# Patient Record
Sex: Female | Born: 1990 | Hispanic: No | Marital: Single | Smoking: Never smoker
Health system: Southern US, Community
[De-identification: ages and names within clinical notes are randomized; demographics above are authoritative.]

---

## 2015-06-14 ENCOUNTER — Emergency Department (HOSPITAL_COMMUNITY): Payer: BLUE CROSS/BLUE SHIELD

## 2015-06-14 ENCOUNTER — Encounter (HOSPITAL_COMMUNITY): Payer: Self-pay | Admitting: Emergency Medicine

## 2015-06-14 ENCOUNTER — Emergency Department (HOSPITAL_COMMUNITY)
Admission: EM | Admit: 2015-06-14 | Discharge: 2015-06-14 | Disposition: A | Discharge status: Home / Self Care | Payer: BLUE CROSS/BLUE SHIELD | Attending: Emergency Medicine | Admitting: Emergency Medicine

## 2015-06-14 DIAGNOSIS — R1013 Epigastric pain: Secondary | ICD-10-CM | POA: Diagnosis not present

## 2015-06-14 DIAGNOSIS — Z3202 Encounter for pregnancy test, result negative: Secondary | ICD-10-CM | POA: Insufficient documentation

## 2015-06-14 DIAGNOSIS — R109 Unspecified abdominal pain: Secondary | ICD-10-CM | POA: Diagnosis present

## 2015-06-14 LAB — COMPREHENSIVE METABOLIC PANEL
ALBUMIN: 4 g/dL (ref 3.5–5.0)
ALK PHOS: 35 U/L — AB (ref 38–126)
ALT: 11 U/L — ABNORMAL LOW (ref 14–54)
ANION GAP: 9 (ref 5–15)
AST: 21 U/L (ref 15–41)
BUN: <5 mg/dL — ABNORMAL LOW (ref 6–20)
CHLORIDE: 104 mmol/L (ref 101–111)
CO2: 24 mmol/L (ref 22–32)
Calcium: 9.3 mg/dL (ref 8.9–10.3)
Creatinine, Ser: 0.67 mg/dL (ref 0.44–1.00)
GFR calc non Af Amer: >60 mL/min (ref >60)
GLUCOSE: 106 mg/dL — AB (ref 65–99)
POTASSIUM: 4.1 mmol/L (ref 3.5–5.1)
SODIUM: 137 mmol/L (ref 135–145)
Total Bilirubin: 0.9 mg/dL (ref 0.3–1.2)
Total Protein: 7.2 g/dL (ref 6.5–8.1)

## 2015-06-14 LAB — URINALYSIS, ROUTINE W REFLEX MICROSCOPIC
BILIRUBIN URINE: NEGATIVE
Glucose, UA: NEGATIVE mg/dL
Ketones, ur: NEGATIVE mg/dL
Leukocytes, UA: NEGATIVE
Nitrite: NEGATIVE
PH: 8 (ref 5.0–8.0)
Protein, ur: NEGATIVE mg/dL
SPECIFIC GRAVITY, URINE: 1.012 (ref 1.005–1.030)

## 2015-06-14 LAB — CBC
HEMATOCRIT: 36.6 % (ref 36.0–46.0)
HEMOGLOBIN: 11.9 g/dL — AB (ref 12.0–15.0)
MCH: 28.3 pg (ref 26.0–34.0)
MCHC: 32.5 g/dL (ref 30.0–36.0)
MCV: 87.1 fL (ref 78.0–100.0)
Platelets: 290 10*3/uL (ref 150–400)
RBC: 4.2 MIL/uL (ref 3.87–5.11)
RDW: 12.4 % (ref 11.5–15.5)
WBC: 8.1 10*3/uL (ref 4.0–10.5)

## 2015-06-14 LAB — LIPASE, BLOOD: LIPASE: 32 U/L (ref 11–51)

## 2015-06-14 LAB — URINE MICROSCOPIC-ADD ON: WBC, UA: NONE SEEN WBC/hpf (ref 0–5)

## 2015-06-14 LAB — POC URINE PREG, ED: Preg Test, Ur: NEGATIVE

## 2015-06-14 MED ORDER — SUCRALFATE 1 G PO TABS: 1.0000 g | ORAL_TABLET | Freq: Three times a day (TID) | ORAL | Status: AC

## 2015-06-14 MED ORDER — HYDROCODONE-ACETAMINOPHEN 5-325 MG PO TABS: 1.0000 | ORAL_TABLET | Freq: Four times a day (QID) | ORAL | Status: AC | PRN

## 2015-06-14 MED ORDER — SODIUM CHLORIDE 0.9 % IV BOLUS (SEPSIS)
500.0000 mL | Freq: Once | INTRAVENOUS | Status: AC
  Administered 2015-06-14: 500 mL via INTRAVENOUS

## 2015-06-14 MED ORDER — ONDANSETRON HCL 4 MG/2ML IJ SOLN
4.0000 mg | Freq: Once | INTRAMUSCULAR | Status: AC
  Administered 2015-06-14: 4 mg via INTRAVENOUS
  Filled 2015-06-14: qty 2

## 2015-06-14 MED ORDER — HYDROMORPHONE HCL 1 MG/ML IJ SOLN
1.0000 mg | Freq: Once | INTRAMUSCULAR | Status: AC
  Administered 2015-06-14: 1 mg via INTRAVENOUS
  Filled 2015-06-14: qty 1

## 2015-06-14 MED ORDER — IOPAMIDOL (ISOVUE-300) INJECTION 61%
INTRAVENOUS | Status: AC
  Administered 2015-06-14: 96 mL
  Filled 2015-06-14: qty 100

## 2015-06-14 MED ORDER — DIATRIZOATE MEGLUMINE & SODIUM 66-10 % PO SOLN
ORAL | Status: DC
Start: 2015-06-14 — End: 2015-06-15
  Filled 2015-06-14: qty 30

## 2015-06-14 MED ORDER — FENTANYL CITRATE (PF) 100 MCG/2ML IJ SOLN
50.0000 ug | Freq: Once | INTRAMUSCULAR | Status: AC
  Administered 2015-06-14: 50 ug via INTRAVENOUS
  Filled 2015-06-14: qty 2

## 2015-06-14 MED ORDER — FAMOTIDINE 20 MG PO TABS: 20.0000 mg | ORAL_TABLET | Freq: Two times a day (BID) | ORAL | Status: AC

## 2015-06-14 MED ORDER — PANTOPRAZOLE SODIUM 40 MG IV SOLR
40.0000 mg | Freq: Once | INTRAVENOUS | Status: AC
  Administered 2015-06-14: 40 mg via INTRAVENOUS
  Filled 2015-06-14: qty 40

## 2015-06-14 MED ORDER — SODIUM CHLORIDE 0.9 % IV SOLN
INTRAVENOUS | Status: DC
Start: 2015-06-14 — End: 2015-06-15
  Administered 2015-06-14: 18:00:00 via INTRAVENOUS

## 2015-06-14 NOTE — ED Notes (Signed)
C/o severe mid upper abd pain since 5am.  Denies nausea, vomiting, and diarrhea.

## 2015-06-14 NOTE — ED Notes (Signed)
Patient transported to CT 

## 2015-06-14 NOTE — ED Notes (Signed)
Patient transported to X-ray 

## 2015-06-14 NOTE — ED Provider Notes (Signed)
CSN: [id number]     Arrival date & time 06/14/15  1555 History   First MD Initiated Contact with Patient 06/14/15 1710     Chief Complaint  Patient presents with  . Abdominal Pain     (Consider location/radiation/quality/duration/timing/severity/associated sxs/prior Treatment) Patient is a 25 y.o. female presenting with abdominal pain. The history is provided by the patient.  Abdominal Pain Associated symptoms: no chest pain, no dysuria, no fever, no nausea, no shortness of breath and no vomiting   Patient with acute onset of epigastric abdominal pain at about 5:30 in the morning. But calm and sharp intermittent waves. 8 out of 10. Nonradiating. Not associated with nausea or vomiting. Would sometimes go away for over an hour. Patient never had any pain like this before.  History reviewed. No pertinent past medical history. History reviewed. No pertinent past surgical history. No family history on file. Social History  Substance Use Topics  . Smoking status: Never Smoker   . Smokeless tobacco: None  . Alcohol Use: Yes     Comment: social   OB History    No data available     Review of Systems  Constitutional: Negative for fever.  HENT: Negative for congestion.   Eyes: Negative for visual disturbance.  Respiratory: Negative for shortness of breath.   Cardiovascular: Negative for chest pain.  Gastrointestinal: Positive for abdominal pain. Negative for nausea and vomiting.  Genitourinary: Negative for dysuria.  Musculoskeletal: Negative for back pain.  Neurological: Negative for headaches.  Hematological: Does not bruise/bleed easily.  Psychiatric/Behavioral: Negative for confusion.      Allergies  Review of patient's allergies indicates no known allergies.  Home Medications   Prior to Admission medications   Medication Sig Start Date End Date Taking? Authorizing Provider  ibuprofen (ADVIL,MOTRIN) 200 MG tablet Take 400 mg by mouth every 6 (six) hours as needed for  headache.   Yes Historical Provider, MD  famotidine (PEPCID) 20 MG tablet Take 1 tablet (20 mg total) by mouth 2 (two) times daily. 06/14/15   Vanetta Mulders, MD  HYDROcodone-acetaminophen (NORCO/VICODIN) 5-325 MG tablet Take 1-2 tablets by mouth every 6 (six) hours as needed for moderate pain. 06/14/15   Vanetta Mulders, MD  sucralfate (CARAFATE) 1 g tablet Take 1 tablet (1 g total) by mouth 4 (four) times daily -  with meals and at bedtime. 06/14/15   Vanetta Mulders, MD   BP 116/76 mmHg  Pulse 68  Temp(Src) 97.9 F (36.6 C) (Oral)  Resp 20  Ht  (1.575 m)  Wt 43.545 kg  BMI 17.55 kg/m2  SpO2 100%  LMP 06/11/2015 Physical Exam  Constitutional: She is oriented to person, place, and time. She appears well-developed and well-nourished.  HENT:  Head: Normocephalic and atraumatic.  Mouth/Throat: Oropharynx is clear and moist.  Eyes: Conjunctivae and EOM are normal. Pupils are equal, round, and reactive to light.  Neck: Normal range of motion. Neck supple.  Cardiovascular: Normal rate, regular rhythm and normal heart sounds.   No murmur heard. Pulmonary/Chest: Effort normal and breath sounds normal. No respiratory distress.  Abdominal: Soft. Bowel sounds are normal. There is no tenderness.  Musculoskeletal: Normal range of motion. She exhibits no edema.  Neurological: She is alert and oriented to person, place, and time. No cranial nerve deficit. She exhibits normal muscle tone. Coordination normal.  Skin: Skin is warm.  Nursing note and vitals reviewed.   ED Course  Procedures (including critical care time) Labs Review Labs Reviewed  COMPREHENSIVE METABOLIC PANEL -  Abnormal; Notable for the following:    Glucose, Bld 106 (*)    BUN <5 (*)    ALT 11 (*)    Alkaline Phosphatase 35 (*)    All other components within normal limits  CBC - Abnormal; Notable for the following:    Hemoglobin 11.9 (*)    All other components within normal limits  URINALYSIS, ROUTINE W REFLEX  MICROSCOPIC (NOT AT [facility]) - Abnormal; Notable for the following:    Hgb urine dipstick MODERATE (*)    All other components within normal limits  URINE MICROSCOPIC-ADD ON - Abnormal; Notable for the following:    Squamous Epithelial / LPF 0-5 (*)    Bacteria, UA RARE (*)    All other components within normal limits  LIPASE, BLOOD  POC URINE PREG, ED    Imaging Review Dg Chest 2 View  06/14/2015  CLINICAL DATA:  Acute onset of epigastric abdominal pain. Initial encounter. EXAM: CHEST  2 VIEW COMPARISON:  None. FINDINGS: The lungs are well-aerated and clear. There is no evidence of focal opacification, pleural effusion or pneumothorax. The heart is normal in size; the mediastinal contour is within normal limits. No acute osseous abnormalities are seen. IMPRESSION: No acute cardiopulmonary process seen. Electronically Signed   By: Roanna RaiderJeffery  Chang M.D.   On: 06/14/2015 18:42   Ct Abdomen Pelvis W Contrast  06/14/2015  CLINICAL DATA:  Acute onset of epigastric abdominal pain. Initial encounter. EXAM: CT ABDOMEN AND PELVIS WITH CONTRAST TECHNIQUE: Multidetector CT imaging of the abdomen and pelvis was performed using the standard protocol following bolus administration of intravenous contrast. CONTRAST:  96mL ISOVUE-300 IOPAMIDOL (ISOVUE-300) INJECTION 61% COMPARISON:  None. FINDINGS: The visualized lung bases are clear. Nonspecific hypodensities are noted within the liver, measuring up to 1.8 cm in size. The spleen is unremarkable in appearance. The gallbladder is within normal limits. The pancreas and adrenal glands are unremarkable. The kidneys are unremarkable in appearance. There is no evidence of hydronephrosis. No renal or ureteral stones are seen. No perinephric stranding is appreciated. No free fluid is identified. The small bowel is unremarkable in appearance. The stomach is within normal limits. No acute vascular abnormalities are seen. The appendix is normal in caliber, without evidence of  appendicitis. The colon is unremarkable in appearance. The bladder is decompressed and not well assessed. The uterus is grossly unremarkable. The ovaries are relatively symmetric. No suspicious adnexal masses are seen. No inguinal lymphadenopathy is seen. No acute osseous abnormalities are identified. IMPRESSION: 1. No acute abnormality seen within the abdomen or pelvis. 2. Nonspecific hypodensities within the liver, measuring up to 1.8 cm in size. Electronically Signed   By: Roanna RaiderJeffery  Chang M.D.   On: 06/14/2015 20:58   I have personally reviewed and evaluated these images and lab results as part of my medical decision-making.   EKG Interpretation None      MDM   Final diagnoses:  Epigastric abdominal pain   Workup for the epigastric abdominal pain without any acute findings on CT. Symptoms therefore seemed to be consistent with possible peptic ulcer disease. Pain is intermittent and she very sharp at times. Will treat symptomatically for this with Carafate and Pepcid and have her follow-up with GI medicine in the [place] area. Patient stable for discharge home.     Vanetta MuldersScott Rozalia Dino, MD 06/14/15 2202

## 2015-06-14 NOTE — Discharge Instructions (Signed)
Take the medications as directed. The Carafate is designed to help coat your stomach. The Pepcid helps cut down the acid. Make an appointment with GI medicine upon return to [place]. Lab results and CT results provided.

## 2015-06-14 NOTE — ED Notes (Signed)
Pt is in stable condition upon d/c and ambulates from ED. 

## 2017-08-15 IMAGING — CT CT ABD-PELV W/ CM
2 of 4 series · 15 of 46 positions shown, 17 images · IV contrast (iopamidol)
Comparison: None.

CLINICAL DATA: Acute onset of epigastric abdominal pain. Initial
encounter.

EXAM:
CT ABDOMEN AND PELVIS WITH CONTRAST
TECHNIQUE: Multidetector CT imaging of the abdomen and pelvis was performed
using the standard protocol following bolus administration of
intravenous contrast.
CONTRAST:  96mL LXGKBM-UPP IOPAMIDOL (LXGKBM-UPP) INJECTION 61%

[Series 2: a/p w/ 5mm · axial · 0.59mm/px · z∈[+750,+1145]mm · 12 of 91 slices shown, 14 images]
[im 8/91  soft-tissue]
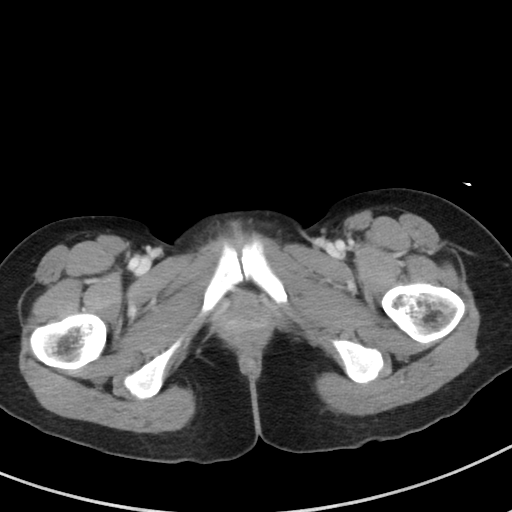
[im 8/91  bone]
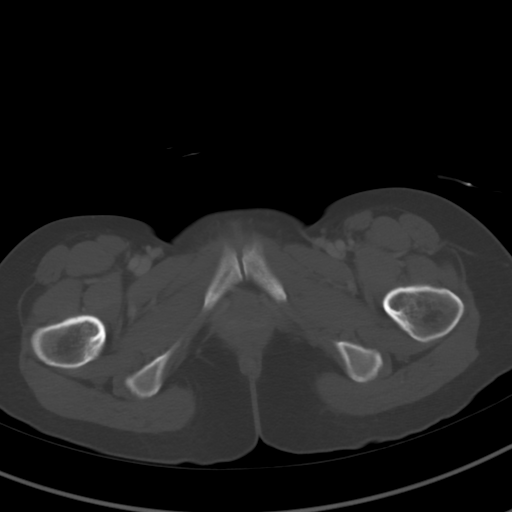
[im 15/91  soft-tissue]
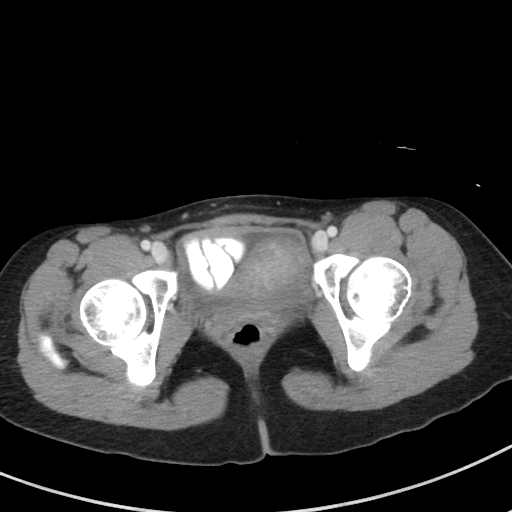
[im 22/91  soft-tissue]
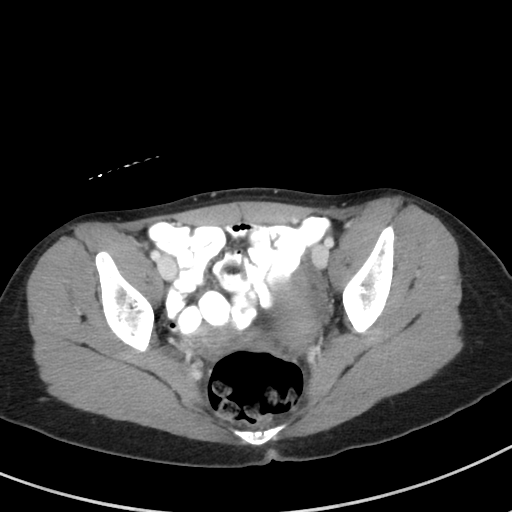
[im 29/91  soft-tissue]
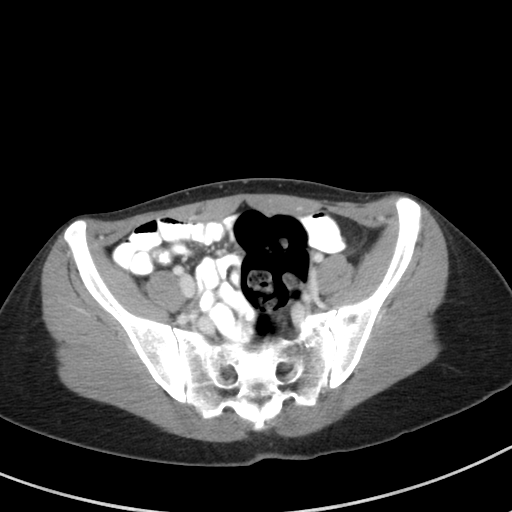
[im 37/91  soft-tissue]
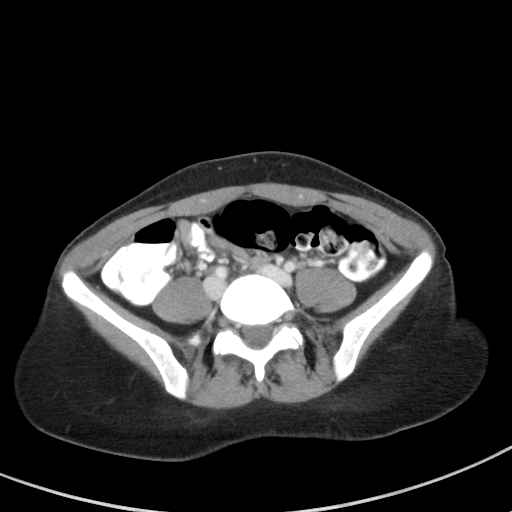
[im 44/91  soft-tissue]
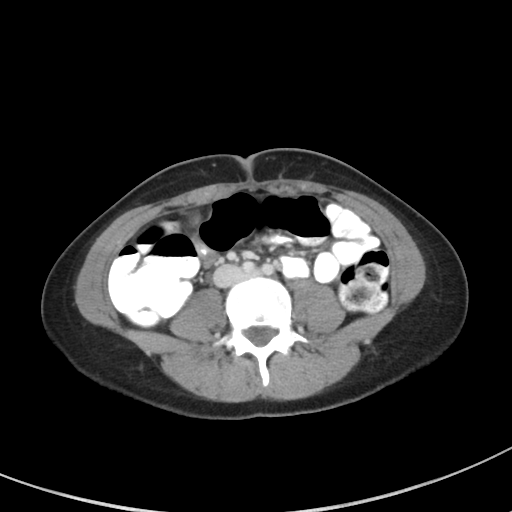
[im 51/91  soft-tissue]
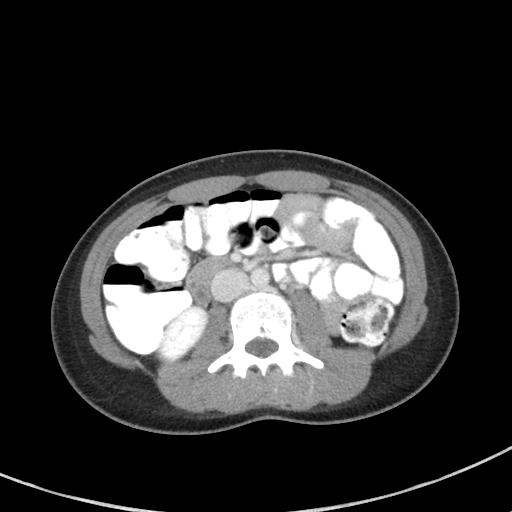
[im 58/91  soft-tissue]
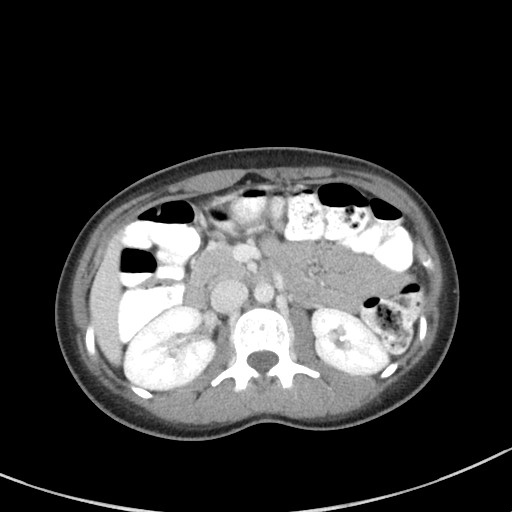
[im 65/91  soft-tissue]
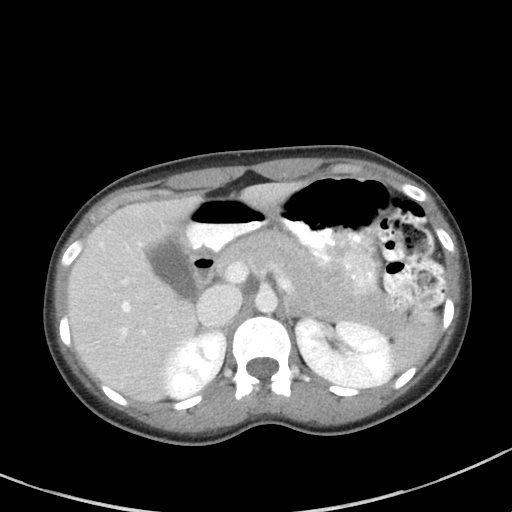
[im 65/91  bone]
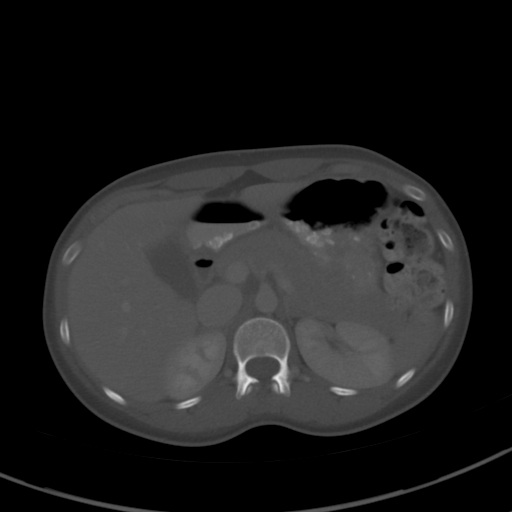
[im 73/91  soft-tissue]
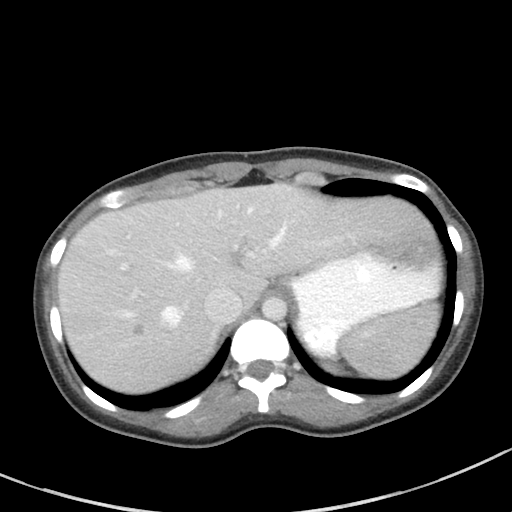
[im 80/91  soft-tissue]
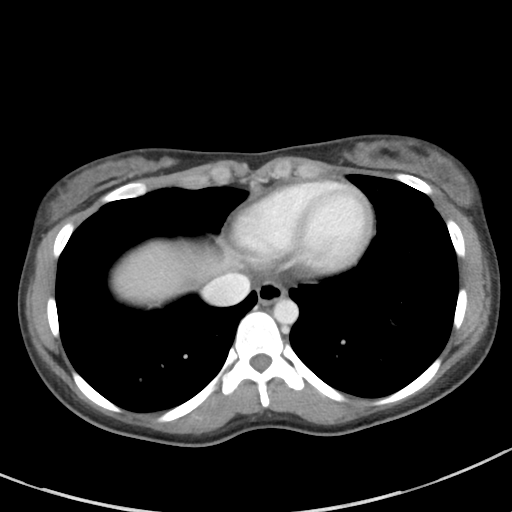
[im 87/91  soft-tissue]
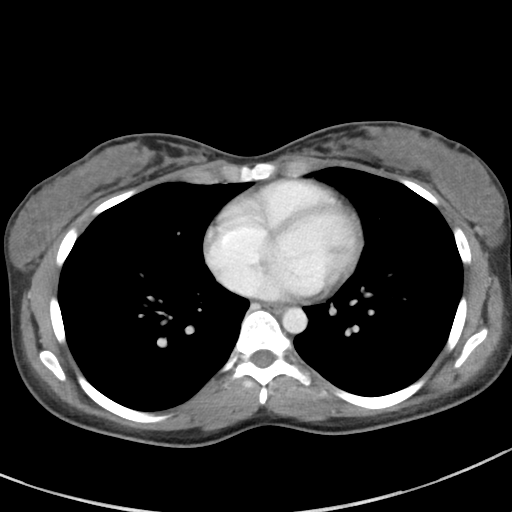

[Series 5: a/p w/ cor · coronal · 0.58mm/px · 3 of 83 slices shown]
[im 28/83  soft-tissue]
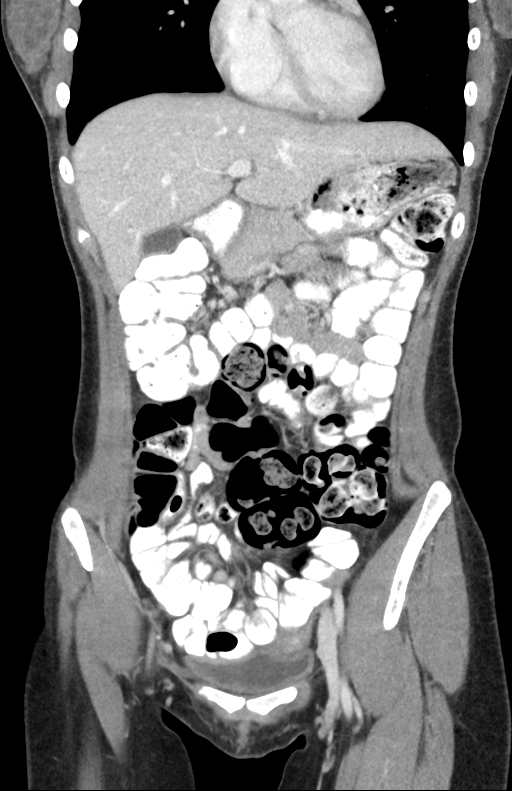
[im 37/83  soft-tissue]
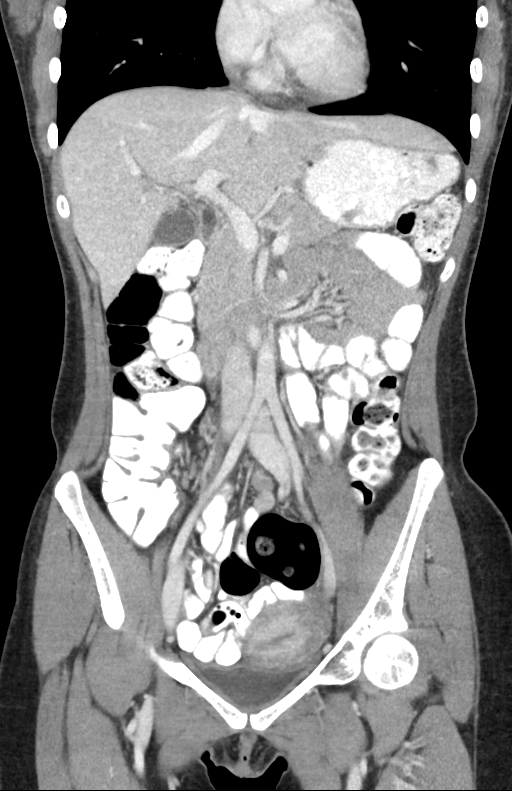
[im 46/83  soft-tissue]
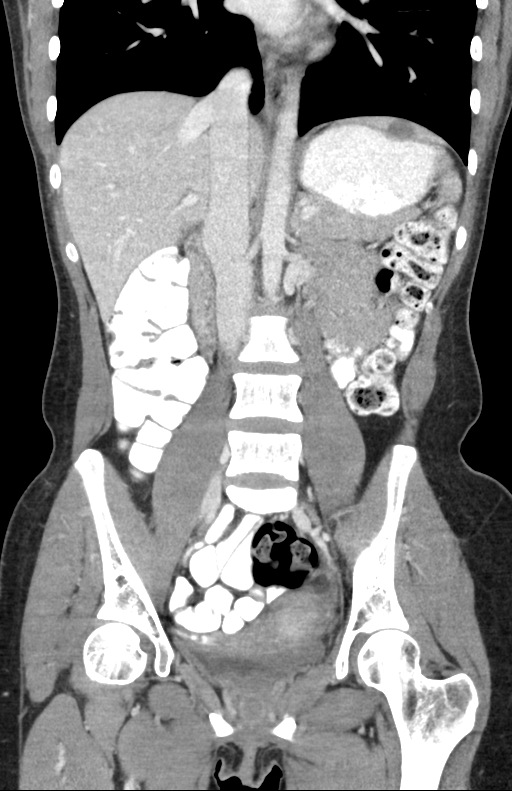

[15 of 46 positions shown; findings below may reference images not displayed]

FINDINGS: The visualized lung bases are clear.

Nonspecific hypodensities are noted within the liver, measuring up
to 1.8 cm in size. The spleen is unremarkable in appearance. The
gallbladder is within normal limits. The pancreas and adrenal glands
are unremarkable.

The kidneys are unremarkable in appearance. There is no evidence of
hydronephrosis. No renal or ureteral stones are seen. No perinephric
stranding is appreciated.

No free fluid is identified. The small bowel is unremarkable in
appearance. The stomach is within normal limits. No acute vascular
abnormalities are seen.

The appendix is normal in caliber, without evidence of appendicitis.
The colon is unremarkable in appearance.

The bladder is decompressed and not well assessed. The uterus is
grossly unremarkable. The ovaries are relatively symmetric. No
suspicious adnexal masses are seen. No inguinal lymphadenopathy is
seen.

No acute osseous abnormalities are identified.
IMPRESSION: 1. No acute abnormality seen within the abdomen or pelvis.
2. Nonspecific hypodensities within the liver, measuring up to
cm in size.
